# Patient Record
Sex: Male | Born: 1975 | Race: White | Hispanic: No | Marital: Married | Smoking: Current every day smoker
Health system: Southern US, Community
[De-identification: ages and names within clinical notes are randomized; demographics above are authoritative.]

## PROBLEM LIST (undated history)

## (undated) DIAGNOSIS — F1010 Alcohol abuse, uncomplicated: Secondary | ICD-10-CM

## (undated) HISTORY — PX: APPENDECTOMY: SHX54

---

## 2017-10-02 ENCOUNTER — Emergency Department: Payer: Self-pay

## 2017-10-02 ENCOUNTER — Emergency Department
Admission: EM | Admit: 2017-10-02 | Discharge: 2017-10-03 | Disposition: A | Discharge status: Home / Self Care | Payer: Self-pay | Attending: Emergency Medicine | Admitting: Emergency Medicine

## 2017-10-02 ENCOUNTER — Other Ambulatory Visit: Payer: Self-pay

## 2017-10-02 DIAGNOSIS — S60221A Contusion of right hand, initial encounter: Secondary | ICD-10-CM

## 2017-10-02 DIAGNOSIS — S51011A Laceration without foreign body of right elbow, initial encounter: Secondary | ICD-10-CM | POA: Insufficient documentation

## 2017-10-02 DIAGNOSIS — Y929 Unspecified place or not applicable: Secondary | ICD-10-CM | POA: Insufficient documentation

## 2017-10-02 DIAGNOSIS — T07XXXA Unspecified multiple injuries, initial encounter: Secondary | ICD-10-CM

## 2017-10-02 DIAGNOSIS — Z9229 Personal history of other drug therapy: Secondary | ICD-10-CM

## 2017-10-02 DIAGNOSIS — S5002XA Contusion of left elbow, initial encounter: Secondary | ICD-10-CM

## 2017-10-02 DIAGNOSIS — S280XXA Crushed chest, initial encounter: Secondary | ICD-10-CM | POA: Insufficient documentation

## 2017-10-02 DIAGNOSIS — Y9389 Activity, other specified: Secondary | ICD-10-CM | POA: Insufficient documentation

## 2017-10-02 DIAGNOSIS — S5001XA Contusion of right elbow, initial encounter: Secondary | ICD-10-CM

## 2017-10-02 DIAGNOSIS — Z23 Encounter for immunization: Secondary | ICD-10-CM | POA: Insufficient documentation

## 2017-10-02 DIAGNOSIS — W231XXA Caught, crushed, jammed, or pinched between stationary objects, initial encounter: Secondary | ICD-10-CM | POA: Insufficient documentation

## 2017-10-02 DIAGNOSIS — S298XXA Other specified injuries of thorax, initial encounter: Secondary | ICD-10-CM

## 2017-10-02 DIAGNOSIS — S20219A Contusion of unspecified front wall of thorax, initial encounter: Secondary | ICD-10-CM

## 2017-10-02 DIAGNOSIS — Y999 Unspecified external cause status: Secondary | ICD-10-CM | POA: Insufficient documentation

## 2017-10-02 HISTORY — DX: Alcohol abuse, uncomplicated: F10.10

## 2017-10-02 MED ORDER — LIDOCAINE HCL (PF) 1 % IJ SOLN
5.0000 mL | Freq: Once | INTRAMUSCULAR | Status: AC
  Administered 2017-10-03: 5 mL via INTRADERMAL
  Filled 2017-10-02: qty 5

## 2017-10-02 MED ORDER — NICOTINE 21 MG/24HR TD PT24
21.0000 mg | MEDICATED_PATCH | Freq: Once | TRANSDERMAL | Status: DC
  Administered 2017-10-02: 21 mg via TRANSDERMAL
  Filled 2017-10-02: qty 1

## 2017-10-02 MED ORDER — TETANUS-DIPHTH-ACELL PERTUSSIS 5-2.5-18.5 LF-MCG/0.5 IM SUSP
0.5000 mL | Freq: Once | INTRAMUSCULAR | Status: AC
  Administered 2017-10-02: 0.5 mL via INTRAMUSCULAR
  Filled 2017-10-02: qty 0.5

## 2017-10-02 MED ORDER — LIDOCAINE HCL (PF) 1 % IJ SOLN
INTRAMUSCULAR | Status: AC
  Administered 2017-10-03: 5 mL via INTRADERMAL
  Filled 2017-10-02: qty 5

## 2017-10-02 NOTE — ED Notes (Signed)
Informed RN that patient has been roomed and is ready for evaluation.  Patient in NAD at this time and call bell placed within reach.   

## 2017-10-02 NOTE — ED Notes (Signed)
Patient with hematoma to left elbow, abrasion and lacerations to right elbow, states chest pain mainly with movement, speaking in complete and coherent sentences.

## 2017-10-02 NOTE — ED Triage Notes (Signed)
Pt arrives to ED via POV s/p crush injury when a car he was working on fell off the [employer]. Pt reports central chest pain and RIGHT elbow pain. Pt reports being trapped for 10-12 secs before being able to self-extricate. Pt reports some tightness in his chest; but appears to be in NAD.

## 2017-10-02 NOTE — ED Notes (Signed)
Patient transported to CT 

## 2017-10-03 MED ORDER — HYDROCODONE-ACETAMINOPHEN 5-325 MG PO TABS
2.0000 | ORAL_TABLET | Freq: Once | ORAL | Status: AC
  Administered 2017-10-03: 2 via ORAL
  Filled 2017-10-03: qty 2

## 2017-10-03 MED ORDER — CEPHALEXIN 500 MG PO CAPS
500.0000 mg | ORAL_CAPSULE | Freq: Once | ORAL | Status: AC
  Administered 2017-10-03: 500 mg via ORAL
  Filled 2017-10-03: qty 1

## 2017-10-03 MED ORDER — CEPHALEXIN 500 MG PO CAPS: 500.0000 mg | ORAL_CAPSULE | Freq: Two times a day (BID) | ORAL | 0 refills | Status: AC

## 2017-10-03 NOTE — Discharge Instructions (Addendum)
As we discussed, fortunately we did not identify any severe injuries that would keep you in the hospital.  You can expect to be extremely sore over at least the next few days if not the next couple of weeks.  Please remember to use the incentive spirometer provided so that you take good, full breaths and keep your lungs clean and open.  Use over-the-counter pain medication as needed, and we recommend that you alternate doses of ibuprofen and Tylenol so that you are taking one medication every 3 hours (and the same medication no more often than every 6 hours).  We recommend Tylenol 1000 mg and ibuprofen 600 mg and alternating doses.  Please follow-up with an urgent care or primary care provider in about a week to have the sutures removed from your right elbow.  There are a total of 5 sutures across 2 separate wounds.  Take the short course of antibiotics to help prevent infection.  Keep the wounds clean and dry and change the dressing twice daily with or without antibiotic ointment.  On the hematomas on your hand, elbows, etc., please use ice, elevation, and possibly Ace wraps for some compression which should help them feel better.    Return to the emergency department if you develop new or worsening symptoms that concern you.

## 2017-10-03 NOTE — ED Provider Notes (Signed)
[facility] Emergency Department Provider Note  ____________________________________________   First MD Initiated Contact with Patient 10/02/17 2059     (approximate)  I have reviewed the triage vital signs and the nursing notes.   HISTORY  Chief Complaint Trauma    HPI Dylan Henson is a 42 y.o. male with no contributory PMH who presents for evaluate of anterior chest wall pain after a crush injury a few hours ago.  He was doing some repair work on his car with was elevated on a jack when the jack fell and the front bumper of the car landed on this anterior chest wall.  He was briefly pinned in place and could not breathe, but managed to wiggle and force his way out from underneath the care, lacerating his right elbow in two places and resulting in some contusions to both upper extremities.  He reports that he collected himself over perhaps a couple of minutes, thankful to be alive, then alerted his wife and they came to the ED.  He obtained a two-view chest x-ray in triage and reports that he still has pain in his chest that is anywhere from moderate to severe with certain movements, coughing, deep breaths, or palpation.  He feels some mild aching pain simply at rest with normal breaths.  He is not having any difficulty breathing anymore and only had difficulty breathing when he was being pinned in place.  He was in his normal state of health before the accident occurred, and happened acutely, and it was severe although currently he is in no acute distress.  He denies headache and head injury, neck pain or neck injury, nausea, vomiting, abdominal pain, and leg pain.  He has a large contusion and swelling to the left elbow, a contusion and some swelling and bruising to his right hand, and to lacerations to his right elbow, but he has full range of motion of all his joints and he states he does not think anything is broken.   Past Medical History:  Diagnosis Date  .  Alcohol abuse     There are no active problems to display for this patient.   Past Surgical History:  Procedure Laterality Date  . APPENDECTOMY      Prior to Admission medications   Medication Sig Start Date End Date Taking? Authorizing Provider  escitalopram (LEXAPRO) 20 MG tablet Take 20 mg by mouth daily. 09/02/17  Yes [provider]  QUEtiapine (SEROQUEL) 100 MG tablet Take 100 mg by mouth at bedtime as needed. 07/08/17  Yes [provider]  tobramycin (TOBREX) 0.3 % ophthalmic solution Place 1 drop into the right eye 3 (three) times daily. 09/16/17  Yes [provider]  cephALEXin (KEFLEX) 500 MG capsule Take 1 capsule (500 mg total) by mouth 2 (two) times daily. 10/03/17   Loleta Rose, MD    Allergies Patient has no known allergies.  No family history on file.  Social History Social History   Tobacco Use  . Smoking status: Current Every Day Smoker  . Smokeless tobacco: Never Used  Substance Use Topics  . Alcohol use: Not on file  . Drug use: Not on file    Review of Systems Constitutional: No fever/chills Eyes: No visual changes. ENT: No sore throat. Cardiovascular: Anterior chest wall pain as described above Respiratory: Shortness of breath at the time of the accident, now improved, but pain with deep inspiration Gastrointestinal: No abdominal pain.  No nausea, no vomiting.  No diarrhea.  No constipation. Genitourinary: Negative for dysuria. Musculoskeletal: Contusions to upper extremities as described above.  Negative for neck pain.  Negative for back pain. Integumentary: Several lacerations around the right elbow as described above Neurological: Negative for headaches, focal weakness or numbness.   ____________________________________________   PHYSICAL EXAM:  VITAL SIGNS: ED Triage Vitals  Enc Vitals Group     BP 10/02/17 1842 (!) 145/91     Pulse Rate 10/02/17 1842 87     Resp 10/02/17 1842 17     Temp 10/02/17 1842 98.8  F (37.1 C)     Temp Source 10/02/17 1842 Oral     SpO2 10/02/17 1842 98 %     Weight 10/02/17 1843 81.6 kg (180 lb)     Height 10/02/17 1843 1.803 m (5\' 11" )     Head Circumference --      Peak Flow --      Pain Score 10/02/17 1843 8     Pain Loc --      Pain Edu? --      Excl. in GC? --     Constitutional: Alert and oriented. Well appearing and in no acute distress. Eyes: Conjunctivae are normal.  Head: Atraumatic. Nose: No congestion/rhinnorhea. Mouth/Throat: Mucous membranes are moist. Neck: No stridor.  No meningeal signs.   Cardiovascular: Normal rate, regular rhythm. Good peripheral circulation. Grossly normal heart sounds. Respiratory: There is no obvious bruising to the patient's anterior chest wall or signs of contusion.  He does have tenderness throughout the anterior chest wall to percussion and palpation.  There is no abrasion or laceration to the chest wall.  Normal respiratory effort.  No retractions. Lungs CTAB.  Pain with deep inspiration Gastrointestinal: Soft and nontender. No distention.  Musculoskeletal: Contusions with some swelling and ecchymosis to the right hand near the MCP of the index finger but no significant tenderness to palpation and normal range of motion.  Large "goose egg" hematoma to the left proximal forearm just distal to the elbow but with normal range of motion and nontender flexion extension of the left elbow.  No lower extremity tenderness nor edema. No gross deformities of extremities.  No tenderness to palpation of the cervical spine. Neurologic:  Normal speech and language. No gross focal neurologic deficits are appreciated.  Skin:  Skin is warm, dry and intact except for a 1.4 cm laceration near the right elbow and another 2 cm laceration to the medial aspect of the right forearm as described in the procedure notes. Psychiatric: Mood and affect are normal. Speech and behavior are normal.  ____________________________________________    LABS (all labs ordered are listed, but only abnormal results are displayed)  Labs Reviewed - No data to display ____________________________________________  EKG  No indication for EKG given traumatic nature of the chest pain ____________________________________________  RADIOLOGY I, Loleta Rose, personally viewed and evaluated these images (plain radiographs) as part of my medical decision making, as well as reviewing the written report by the radiologist.  I also discussed the appropriate CT scan imaging with the radiologist by phone.  ED MD interpretation: No evidence of acute abnormality on chest radiographs.  Pulmonary nodules on chest CT but no evidence of fracture or dislocation nor of pulmonary contusion  Official radiology report(s): Dg Chest 2 View  Result Date: 10/02/2017 CLINICAL DATA:  Chest pain/injury EXAM: CHEST - 2 VIEW COMPARISON:  None. FINDINGS: Lungs are clear.  No pleural effusion or pneumothorax. The heart is normal in size. Visualized osseous structures are within  normal limits. IMPRESSION: Normal chest radiographs. Electronically Signed   By: Charline Bills M.D.   On: 10/02/2017 19:21   Dg Elbow 2 Views Left  Result Date: 10/02/2017 CLINICAL DATA:  Blunt injury.  Swelling and hematoma. EXAM: LEFT ELBOW - 2 VIEW COMPARISON:  None. FINDINGS: There is no evidence of fracture, dislocation, or joint effusion. There is no evidence of arthropathy or other focal bone abnormality. Soft tissues are unremarkable. IMPRESSION: Negative. Electronically Signed   By: Gerome Sam III M.D   On: 10/02/2017 21:56   Ct Chest Wo Contrast  Result Date: 10/02/2017 CLINICAL DATA:  Central chest pain, status post crush injury under car EXAM: CT CHEST WITHOUT CONTRAST TECHNIQUE: Multidetector CT imaging of the chest was performed following the standard protocol without IV contrast. COMPARISON:  Chest radiographs dated 10/02/2017 FINDINGS: Cardiovascular: The heart is normal in  size. No pericardial effusion. No evidence of thoracic aortic aneurysm. Mediastinum/Nodes: No evidence of mediastinal/substernal hematoma. No suspicious mediastinal lymphadenopathy. Visualized thyroid is unremarkable. Lungs/Pleura: No evidence of pulmonary contusion. Mild patchy opacities in the lingula and bilateral lower lobes, likely atelectasis. No focal consolidation. 2-3 mm subpleural nodules in the right upper lobe (series 3/images 40, 43, 45, and 50). No pleural effusion or pneumothorax. Upper Abdomen: Visualized upper abdomen is grossly unremarkable. Musculoskeletal: No fracture is seen. Specifically, no evidence of rib or sternal fracture. IMPRESSION: No evidence of traumatic injury to the chest. No evidence of pulmonary contusion. Scattered 2-3 mm subpleural nodules in the right upper lobe, likely benign. No follow-up needed if patient is low-risk (and has no known or suspected primary neoplasm). Non-contrast chest CT can be considered in 12 months if patient is high-risk. This recommendation follows the consensus statement: Guidelines for Management of Incidental Pulmonary Nodules Detected on CT Images: From the Fleischner Society 2017; Radiology 2017; 284:228-243. Electronically Signed   By: Charline Bills M.D.   On: 10/02/2017 22:24    ____________________________________________   PROCEDURES  Critical Care performed: No   Procedure(s) performed:   Marland KitchenMarland KitchenLaceration Repair Date/Time: 10/03/2017 12:03 AM Performed by: Loleta Rose, MD Authorized by: Loleta Rose, MD   Consent:    Consent obtained:  Verbal   Consent given by:  Patient   Risks discussed:  Infection, pain, retained foreign body, poor cosmetic result and poor wound healing Anesthesia (see MAR for exact dosages):    Anesthesia method:  Local infiltration   Local anesthetic:  Lidocaine 1% w/o epi Laceration details:    Location:  Shoulder/arm   Shoulder/arm location:  R elbow   Length (cm):  1.4 Repair type:     Repair type:  Simple Exploration:    Hemostasis achieved with:  Direct pressure   Wound exploration: entire depth of wound probed and visualized     Contaminated: no   Treatment:    Area cleansed with:  Saline   Amount of cleaning:  Extensive   Irrigation solution:  Sterile saline   Visualized foreign bodies/material removed: no   Skin repair:    Repair method:  Sutures   Suture size:  4-0   Suture material:  Prolene   Suture technique:  Simple interrupted   Number of sutures:  2 Approximation:    Approximation:  Close Post-procedure details:    Dressing:  Sterile dressing   Patient tolerance of procedure:  Tolerated well, no immediate complications .Marland KitchenLaceration Repair Date/Time: 10/03/2017 12:04 AM Performed by: Loleta Rose, MD Authorized by: Loleta Rose, MD   Consent:    Consent obtained:  Verbal   Consent given by:  Patient   Risks discussed:  Infection, pain, retained foreign body, poor cosmetic result and poor wound healing Anesthesia (see MAR for exact dosages):    Anesthesia method:  Local infiltration   Local anesthetic:  Lidocaine 1% w/o epi Laceration details:    Location:  Shoulder/arm   Shoulder/arm location:  R elbow   Length (cm):  2 Repair type:    Repair type:  Simple Exploration:    Hemostasis achieved with:  Direct pressure   Wound exploration: entire depth of wound probed and visualized     Contaminated: no   Treatment:    Area cleansed with:  Saline   Amount of cleaning:  Extensive   Irrigation solution:  Sterile saline   Visualized foreign bodies/material removed: no   Skin repair:    Repair method:  Sutures   Suture size:  4-0   Suture material:  Prolene   Number of sutures:  3 Approximation:    Approximation:  Close Post-procedure details:    Dressing:  Sterile dressing   Patient tolerance of procedure:  Tolerated well, no immediate complications     ____________________________________________   INITIAL IMPRESSION /  ASSESSMENT AND PLAN / ED COURSE  As part of my medical decision making, I reviewed the following data within the electronic MEDICAL RECORD NUMBER History obtained from family, Nursing notes reviewed and incorporated, Radiograph reviewed  and Discussed with radiologist    Differential diagnosis includes, but is not limited to, musculoskeletal pain/strain in the setting of contusion and blunt chest trauma, pulmonary contusion, cardiac contusion, internal bleeding, fracture/dislocation.  Fortunately the patient is stable and had been waiting for an exam room for more than 2 hours at the time I saw him.  Vital signs are stable and within normal limits.  No labs were obtained given the musculoskeletal and traumatic nature of the injuries with no sign of internal bleeding or unstable hemodynamics.  Given the possibility of a midsternal fracture or pulmonary contusion, I obtained a CT scan without IV contrast after discussing the appropriate imaging modality with radiology.  Fortunately the CT scan was reassuring.  I informed the patient verbally about the pulmonary nodules and encouraged outpatient follow-up within 6 months given that he is a tobacco smoker and he understands.  Radiographs of the left elbow were reassuring.  Patient has a large hematoma on that side we discussed cryotherapy, compression, elevation, etc.  He also has as much smaller ecchymosis and contusion to his right hand but in no case with his various contusions is there any evidence of any fracture dislocation I do not believe he would benefit from x-rays of his hand at this time.  I repaired his lacerations on the right forearm as described in the procedure notes above.  Given that the wounds were at least somewhat dirty since he was working on his car, we irrigated the wounds thoroughly and I gave him a Keflex 500 mg by mouth capsule and gave him a week course of twice daily Keflex which should be adequate for prophylactic antibiosis.  I  also provided an incentive spirometer to help encourage adequate breathing given the chest contusion.  I encouraged over-the-counter pain medicine but I gave him to Norco by mouth prior to discharge to help with the acute discomfort.  I gave him strict return precautions in my usual customary sutured wound care recommendations.  He and his wife understand and agree with the plan.  I gave have a tetanus  vaccine given the nature of the injuries and the fact that he thinks his last tetanus vaccination was more than 10 years ago.     ____________________________________________  FINAL CLINICAL IMPRESSION(S) / ED DIAGNOSES  Final diagnoses:  Blunt trauma to chest, initial encounter  Contusion of chest wall with intact skin  Traumatic hematoma of left elbow, initial encounter  Contusion of right elbow, initial encounter  Multiple lacerations  Contusion of right hand, initial encounter  History of tetanus, diphtheria, and acellular pertussis booster vaccination (Tdap)     MEDICATIONS GIVEN DURING THIS VISIT:  Medications  nicotine (NICODERM CQ - dosed in mg/24 hours) patch 21 mg (21 mg Transdermal Patch Applied 10/02/17 2252)  Tdap (BOOSTRIX) injection 0.5 mL (0.5 mLs Intramuscular Given 10/02/17 2122)  lidocaine (PF) (XYLOCAINE) 1 % injection 5 mL (5 mLs Intradermal Given 10/03/17 0024)  cephALEXin (KEFLEX) capsule 500 mg (500 mg Oral Given 10/03/17 0024)  HYDROcodone-acetaminophen (NORCO/VICODIN) 5-325 MG per tablet 2 tablet (2 tablets Oral Given 10/03/17 0023)     ED Discharge Orders        Ordered    cephALEXin (KEFLEX) 500 MG capsule  2 times daily     10/03/17 0012       Note:  This document was prepared using Dragon voice recognition software and may include unintentional dictation errors.    Loleta Rose, MD 10/03/17 [phone]

## 2019-09-06 IMAGING — CT CT CHEST W/O CM
2 of 4 series · 15 of 36 positions shown, 18 images · non-contrast
Comparison: Chest radiographs dated 10/02/2017

CLINICAL DATA: Central chest pain, status post crush injury under
car

EXAM:
CT CHEST WITHOUT CONTRAST
TECHNIQUE: Multidetector CT imaging of the chest was performed following the
standard protocol without IV contrast.

[Series 2: thorax · axial · 0.74mm/px · z∈[+182,+456]mm · 12 of 163 slices shown, 15 images]
[im 13/163  mediastinal]
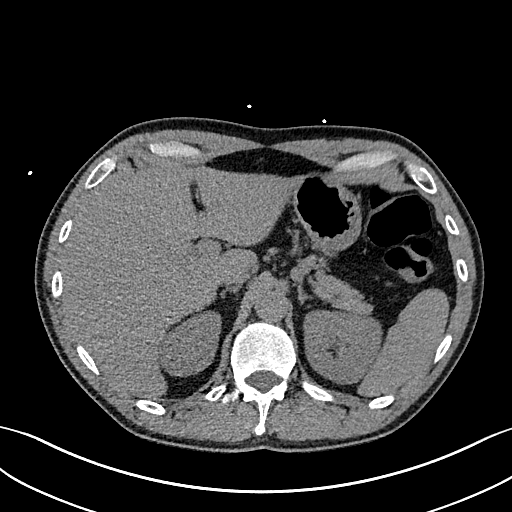
[im 13/163  lung]
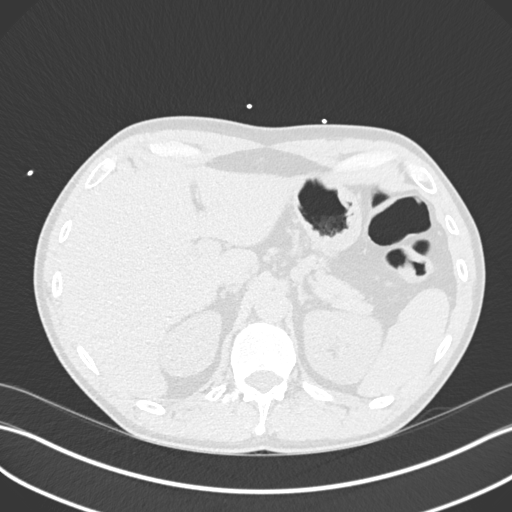
[im 25/163  lung]
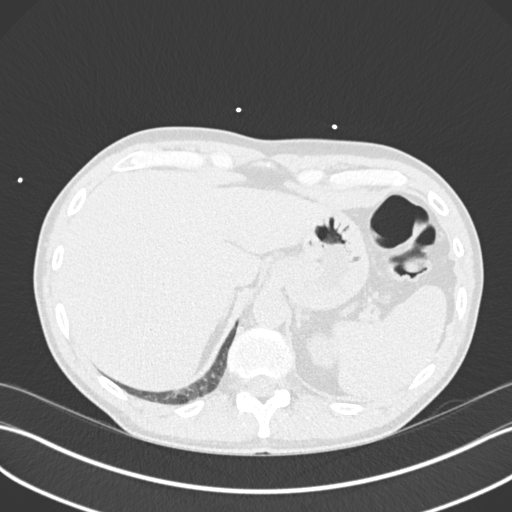
[im 38/163  lung]
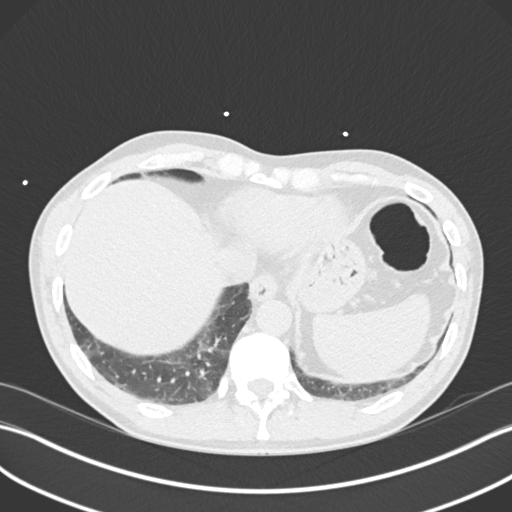
[im 50/163  lung]
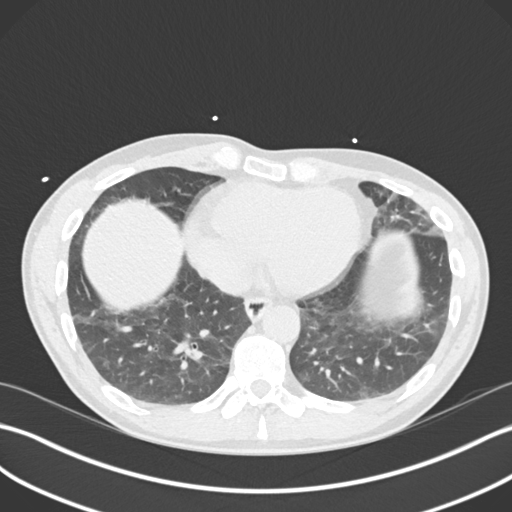
[im 63/163  mediastinal]
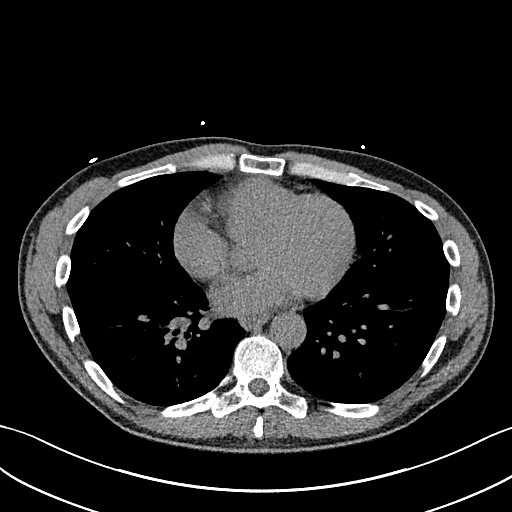
[im 63/163  lung]
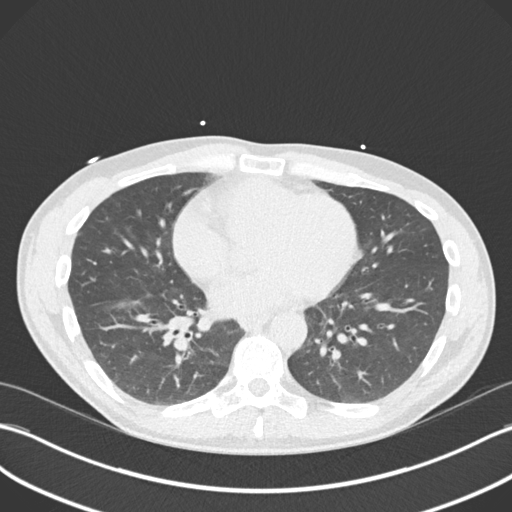
[im 75/163  lung]
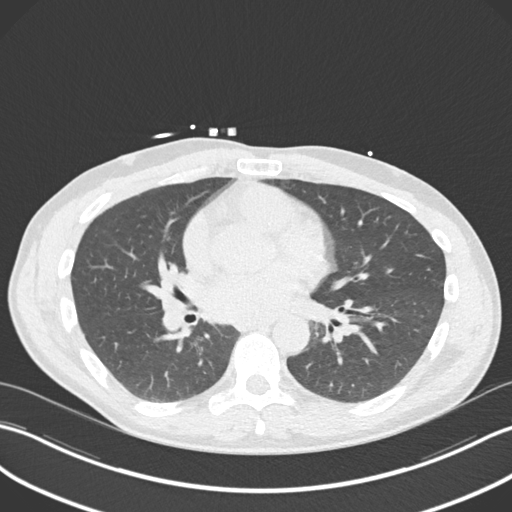
[im 88/163  lung]
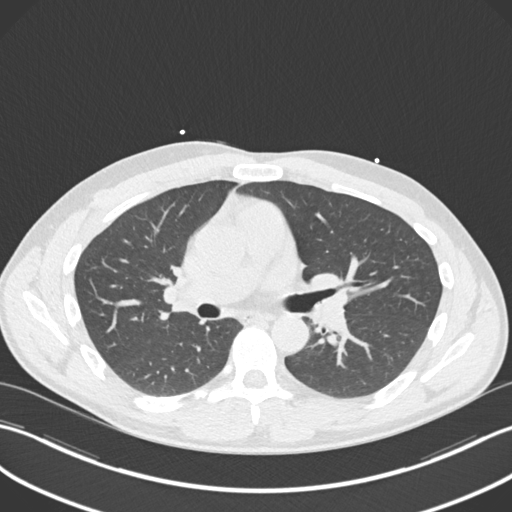
[im 100/163  lung]
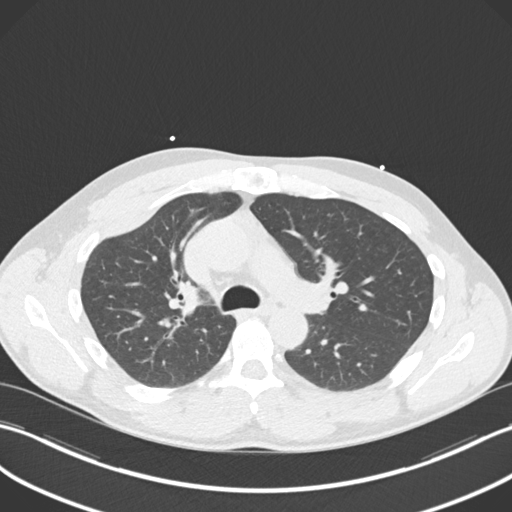
[im 113/163  mediastinal]
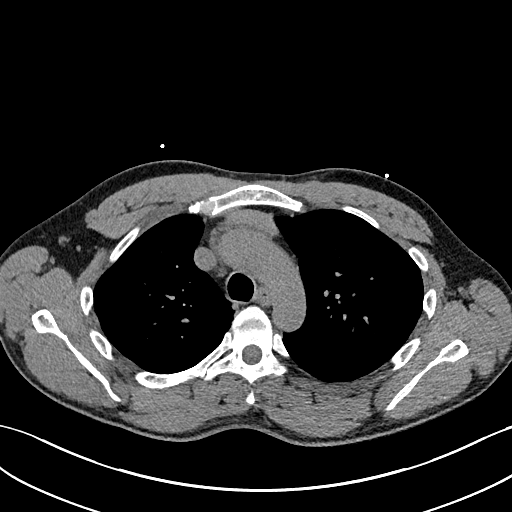
[im 113/163  lung]
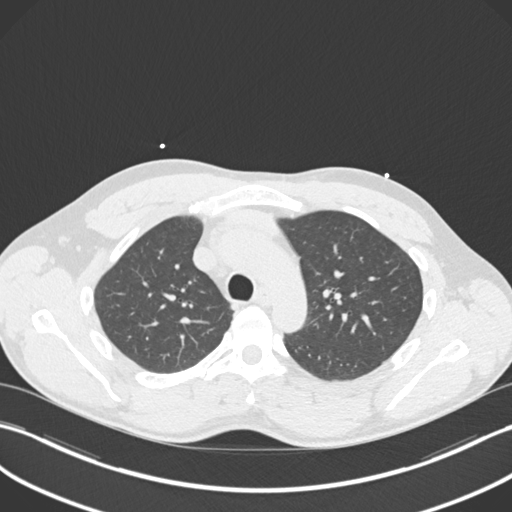
[im 125/163  lung]
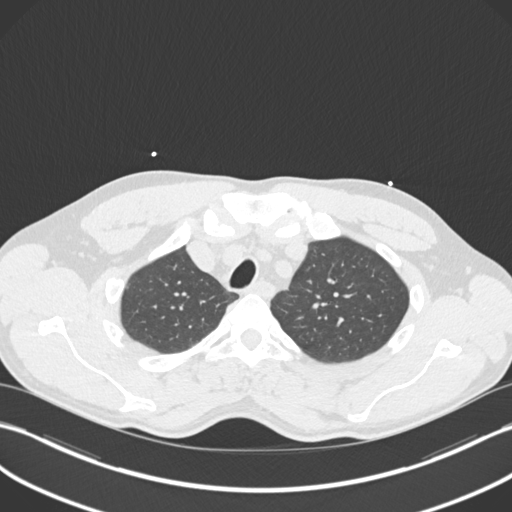
[im 138/163  lung]
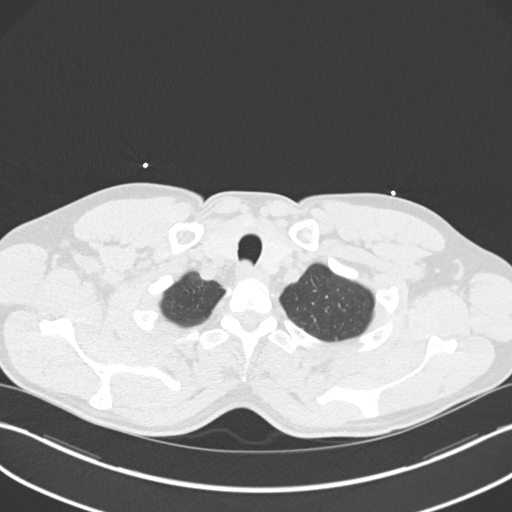
[im 150/163  lung]
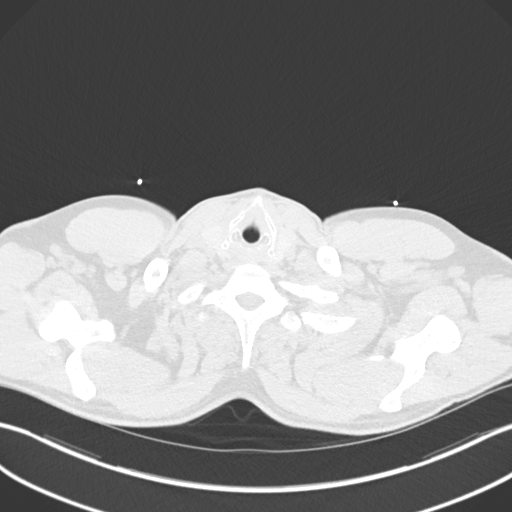

[Series 6: coronal · coronal · 0.68mm/px · 3 of 116 slices shown]
[im 24/116  lung]
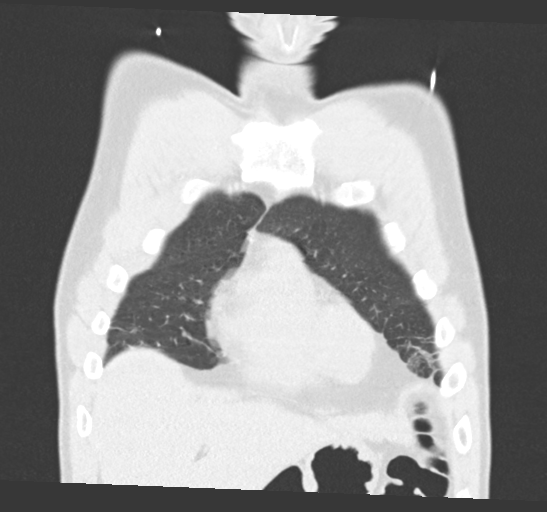
[im 47/116  lung]
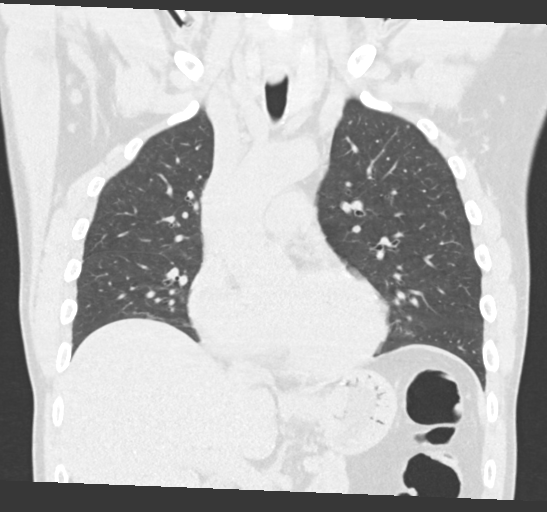
[im 70/116  lung]
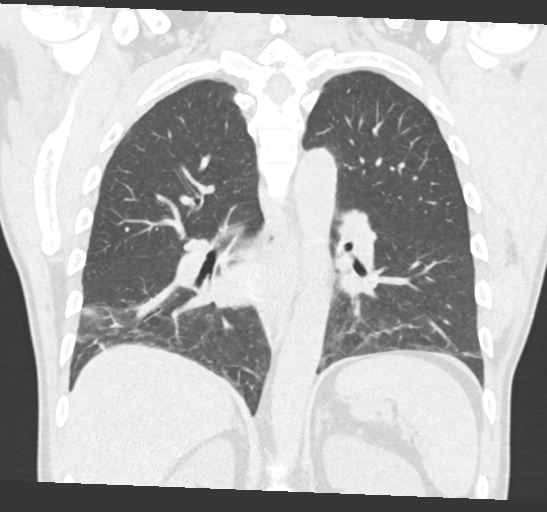

[15 of 36 positions shown; findings below may reference images not displayed]

FINDINGS: Cardiovascular: The heart is normal in size. No pericardial
effusion.

No evidence of thoracic aortic aneurysm.

Mediastinum/Nodes: No evidence of mediastinal/substernal hematoma.

No suspicious mediastinal lymphadenopathy.

Visualized thyroid is unremarkable.

Lungs/Pleura: No evidence of pulmonary contusion.

Mild patchy opacities in the lingula and bilateral lower lobes,
likely atelectasis.

No focal consolidation.

2-3 mm subpleural nodules in the right upper lobe (series 3/images
40, 43, 45, and 50).

No pleural effusion or pneumothorax.

Upper Abdomen: Visualized upper abdomen is grossly unremarkable.

Musculoskeletal: No fracture is seen. Specifically, no evidence of
rib or sternal fracture.
IMPRESSION: No evidence of traumatic injury to the chest.

No evidence of pulmonary contusion.

Scattered 2-3 mm subpleural nodules in the right upper lobe, likely
benign. No follow-up needed if patient is low-risk (and has no known
or suspected primary neoplasm). Non-contrast chest CT can be
considered in 12 months if patient is high-risk. This recommendation
follows the consensus statement: Guidelines for Management of
Incidental Pulmonary Nodules Detected on CT Images: From the
